# Patient Record
Sex: Male | Born: 1960 | Hispanic: Yes | Marital: Married | Smoking: Current every day smoker
Health system: Southern US, Community
[De-identification: ages and names within clinical notes are randomized; demographics above are authoritative.]

---

## 2016-05-26 ENCOUNTER — Emergency Department (HOSPITAL_COMMUNITY): Payer: Self-pay

## 2016-05-26 ENCOUNTER — Emergency Department (HOSPITAL_COMMUNITY)
Admission: EM | Admit: 2016-05-26 | Discharge: 2016-05-26 | Disposition: A | Discharge status: Home / Self Care | Payer: Self-pay | Attending: Emergency Medicine | Admitting: Emergency Medicine

## 2016-05-26 ENCOUNTER — Encounter (HOSPITAL_COMMUNITY): Payer: Self-pay | Admitting: *Deleted

## 2016-05-26 DIAGNOSIS — Y929 Unspecified place or not applicable: Secondary | ICD-10-CM | POA: Insufficient documentation

## 2016-05-26 DIAGNOSIS — Y939 Activity, unspecified: Secondary | ICD-10-CM | POA: Insufficient documentation

## 2016-05-26 DIAGNOSIS — S62634B Displaced fracture of distal phalanx of right ring finger, initial encounter for open fracture: Secondary | ICD-10-CM

## 2016-05-26 DIAGNOSIS — S62636A Displaced fracture of distal phalanx of right little finger, initial encounter for closed fracture: Secondary | ICD-10-CM | POA: Insufficient documentation

## 2016-05-26 DIAGNOSIS — Z23 Encounter for immunization: Secondary | ICD-10-CM | POA: Insufficient documentation

## 2016-05-26 DIAGNOSIS — Y990 Civilian activity done for income or pay: Secondary | ICD-10-CM | POA: Insufficient documentation

## 2016-05-26 DIAGNOSIS — W230XXA Caught, crushed, jammed, or pinched between moving objects, initial encounter: Secondary | ICD-10-CM | POA: Insufficient documentation

## 2016-05-26 DIAGNOSIS — F17200 Nicotine dependence, unspecified, uncomplicated: Secondary | ICD-10-CM | POA: Insufficient documentation

## 2016-05-26 DIAGNOSIS — S61218A Laceration without foreign body of other finger without damage to nail, initial encounter: Secondary | ICD-10-CM

## 2016-05-26 LAB — CBC WITH DIFFERENTIAL/PLATELET
Basophils Absolute: 0.1 10*3/uL (ref 0.0–0.1)
Basophils Relative: 1 %
Eosinophils Absolute: 0.2 10*3/uL (ref 0.0–0.7)
Eosinophils Relative: 3 %
HCT: 41.8 % (ref 39.0–52.0)
Hemoglobin: 14.6 g/dL (ref 13.0–17.0)
Lymphocytes Relative: 31 %
Lymphs Abs: 2.4 10*3/uL (ref 0.7–4.0)
MCH: 29.9 pg (ref 26.0–34.0)
MCHC: 34.9 g/dL (ref 30.0–36.0)
MCV: 85.5 fL (ref 78.0–100.0)
Monocytes Absolute: 0.7 10*3/uL (ref 0.1–1.0)
Monocytes Relative: 9 %
Neutro Abs: 4.5 10*3/uL (ref 1.7–7.7)
Neutrophils Relative %: 56 %
Platelets: 151 10*3/uL (ref 150–400)
RBC: 4.89 MIL/uL (ref 4.22–5.81)
RDW: 12.7 % (ref 11.5–15.5)
WBC: 7.8 10*3/uL (ref 4.0–10.5)

## 2016-05-26 LAB — BASIC METABOLIC PANEL
Anion gap: 9 (ref 5–15)
BUN: 16 mg/dL (ref 6–20)
CO2: 24 mmol/L (ref 22–32)
Calcium: 8.9 mg/dL (ref 8.9–10.3)
Chloride: 102 mmol/L (ref 101–111)
Creatinine, Ser: 0.77 mg/dL (ref 0.61–1.24)
GFR calc Af Amer: >60 mL/min (ref >60)
GFR calc non Af Amer: >60 mL/min (ref >60)
Glucose, Bld: 101 mg/dL — ABNORMAL HIGH (ref 65–99)
Potassium: 4.2 mmol/L (ref 3.5–5.1)
Sodium: 135 mmol/L (ref 135–145)

## 2016-05-26 MED ORDER — CEPHALEXIN 500 MG PO CAPS: 500.0000 mg | ORAL_CAPSULE | Freq: Four times a day (QID) | ORAL | 0 refills | Status: AC

## 2016-05-26 MED ORDER — CEFAZOLIN IN D5W 1 GM/50ML IV SOLN
1.0000 g | Freq: Once | INTRAVENOUS | Status: AC
  Administered 2016-05-26: 1 g via INTRAVENOUS
  Filled 2016-05-26: qty 50

## 2016-05-26 MED ORDER — LIDOCAINE HCL (PF) 1 % IJ SOLN
30.0000 mL | Freq: Once | INTRAMUSCULAR | Status: AC
  Administered 2016-05-26: 30 mL
  Filled 2016-05-26: qty 30

## 2016-05-26 MED ORDER — TETANUS-DIPHTH-ACELL PERTUSSIS 5-2.5-18.5 LF-MCG/0.5 IM SUSP
0.5000 mL | Freq: Once | INTRAMUSCULAR | Status: AC
  Administered 2016-05-26: 0.5 mL via INTRAMUSCULAR
  Filled 2016-05-26: qty 0.5

## 2016-05-26 MED ORDER — OXYCODONE-ACETAMINOPHEN 5-325 MG PO TABS: 1.0000 | ORAL_TABLET | Freq: Four times a day (QID) | ORAL | 0 refills | Status: AC | PRN

## 2016-05-26 NOTE — ED Notes (Signed)
Dressed pt's injured finger with xeroform gauze, gauze roll, and coban, per PA instructions.

## 2016-05-26 NOTE — ED Triage Notes (Addendum)
The pt was working with a drill  And his fingeer was caught between two pieces of wood an he has laceration through the mid and distal phalanx he came from a urgent care  Bandaged  No active bleeding.  His injury is his rt little finger

## 2016-05-26 NOTE — ED Notes (Signed)
Patient denies pain and is resting comfortably.  

## 2016-05-26 NOTE — ED Provider Notes (Signed)
[facility]-EMERGENCY DEPT Provider Note   CSN: [id number] Arrival date & time: 05/26/16  1544   By signing my name below, I, Clarisse Gouge, attest that this documentation has been prepared under the direction and in the presence of Buel Ream, PA-C. Electronically Signed: Clarisse Gouge, Scribe. 05/26/16. 5:10 PM.   History   Chief Complaint Chief Complaint  Patient presents with  . Laceration  The history is provided by the patient and a friend. No language interpreter was used.   HPI Comments: Kyle Hubbard is a 55 y.o. male otherwise healthy who presents to the Emergency Department complaining of a right small finger laceration that he sustained a few hours ago. A coworker states that the pt pinched the injured finger between two metal beams. Pt reports mild associated pain to the affected area. Per coworker, the bleeding has greatly decreased since the onset of the injury. Pt denies any other injury.   History reviewed. No pertinent past medical history.  There are no active problems to display for this patient.   History reviewed. No pertinent surgical history.     Home Medications    Prior to Admission medications   Medication Sig Start Date End Date Taking? Authorizing Provider  cephALEXin (KEFLEX) 500 MG capsule Take 1 capsule (500 mg total) by mouth 4 (four) times daily. 05/26/16   Emi Holes, PA-C  oxyCODONE-acetaminophen (PERCOCET/ROXICET) 5-325 MG tablet Take 1-2 tablets by mouth every 6 (six) hours as needed for severe pain. 05/26/16   Emi Holes, PA-C    Family History No family history on file.  Social History Social History  Substance Use Topics  . Smoking status: Current Every Day Smoker  . Smokeless tobacco: Never Used  . Alcohol use Yes     Allergies   Patient has no known allergies.   Review of Systems Review of Systems  Constitutional: Negative for chills and fever.  HENT: Negative for facial swelling and sore throat.   Respiratory:  Negative for shortness of breath.   Cardiovascular: Negative for chest pain.  Gastrointestinal: Negative for abdominal pain, nausea and vomiting.  Genitourinary: Negative for dysuria.  Musculoskeletal: Positive for myalgias. Negative for back pain.  Skin: Positive for wound. Negative for rash.  Neurological: Negative for headaches.  Psychiatric/Behavioral: The patient is not nervous/anxious.      Physical Exam Updated Vital Signs BP 158/74 (BP Location: Right Arm)   Pulse 61   Temp 98.4 F (36.9 C) (Oral)   Resp 20   SpO2 98%   Physical Exam  Constitutional: He appears well-developed and well-nourished. No distress.  HENT:  Head: Normocephalic and atraumatic.  Mouth/Throat: Oropharynx is clear and moist. No oropharyngeal exudate.  Eyes: Conjunctivae are normal. Pupils are equal, round, and reactive to light. Right eye exhibits no discharge. Left eye exhibits no discharge. No scleral icterus.  Neck: Normal range of motion. Neck supple. No thyromegaly present.  Cardiovascular: Normal rate, regular rhythm, normal heart sounds and intact distal pulses.  Exam reveals no gallop and no friction rub.   No murmur heard. Pulmonary/Chest: Effort normal and breath sounds normal. No stridor. No respiratory distress. He has no wheezes. He has no rales.  Abdominal: Soft. Bowel sounds are normal. He exhibits no distension. There is no tenderness. There is no rebound and no guarding.  Musculoskeletal: He exhibits no edema.  Right hand: Abduction, adduction intact; flexion of DIP not intact, extension intact; PIP flexion and extension intact; found to finger opposition intact; normal sensation; cap refill <2secs;  laceration to medial aspect of the fifth digit, 5 cm; shallow 2 cm laceration to dorsal fifth digit chest proximal to the nail; 2 cm laceration to finger pad; one severe laceration with protruding adipose; see images for more details  Lymphadenopathy:    He has no cervical adenopathy.    Neurological: He is alert. Coordination normal.  Skin: Skin is warm and dry. Capillary refill takes less than 2 seconds. No rash noted. He is not diaphoretic. No pallor.  Psychiatric: He has a normal mood and affect.  Nursing note and vitals reviewed.          ED Treatments / Results  DIAGNOSTIC STUDIES: Oxygen Saturation is 98% on RA, normal by my interpretation.    COORDINATION OF CARE: 4:32 PM Discussed treatment plan with pt at bedside and pt agreed to plan.    Labs (all labs ordered are listed, but only abnormal results are displayed) Labs Reviewed  BASIC METABOLIC PANEL - Abnormal; Notable for the following:       Result Value   Glucose, Bld 101 (*)    All other components within normal limits  CBC WITH DIFFERENTIAL/PLATELET    EKG  EKG Interpretation None       Radiology Dg Finger Little Right  Result Date: 05/26/2016 CLINICAL DATA:  55 year old male with a history of laceration. Industrial injury. EXAM: RIGHT LITTLE FINGER 2+V COMPARISON:  None. FINDINGS: Overlying dressing material somewhat obscures details, however, there is a fracture of the tuft of the distal phalanx of the fifth digit, with partials dorsal subluxation of the distal fracture fragment. There appears to be a fracture involving the base of the distal phalanx of the fifth digit, ulnar aspect. Overlying soft tissue injury. No radiopaque foreign body. IMPRESSION: Open fracture of the distal phalanx of the fifth digit right hand, with partially displaced fracture at the tuft, as well as a fracture line involving the base of the distal phalanx. Soft tissue disruption. Signed, Yvone NeuJaime S. Loreta AveWagner, DO [facility] Electronically Signed   By: Gilmer MorJaime  Wagner D.O.   On: 05/26/2016 17:23    Procedures Procedures (including critical care time)  LACERATION REPAIR Performed by: Emi HolesAlexandra M Claryssa Sandner Authorized by: Emi HolesAlexandra M Jaiyla Granados Consent: Verbal consent  obtained. Risks and benefits: risks, benefits and alternatives were discussed Consent given by: patient Patient identity confirmed: provided demographic data Prepped and Draped in normal sterile fashion Wound explored  Laceration Location: R small finger  Laceration Length: 5 cm  No Foreign Bodies seen or palpated  Anesthesia: Digital block  Anesthetic: lidocaine 1% w/o epinephrine  Anesthetic total: 7 ml  Irrigation method: syringe Amount of cleaning: standard  Skin closure: 4-0 Ethilon  Number of sutures: 2  Technique: simple interrupted  Patient tolerance: Patient tolerated the procedure well with no immediate complications.  LACERATION REPAIR Performed by: Emi HolesAlexandra M Lou Irigoyen Authorized by: Emi HolesAlexandra M Sael Furches Consent: Verbal consent obtained. Risks and benefits: risks, benefits and alternatives were discussed Consent given by: patient Patient identity confirmed: provided demographic data Prepped and Draped in normal sterile fashion Wound explored, adipose debrided  Laceration Location: R small finger  Laceration Length: 1 cm  No Foreign Bodies seen or palpated  Anesthesia: Digital block  Anesthetic: lidocaine 1% w/o epinephrine  Anesthetic total: 7 ml  Irrigation method: syringe Amount of cleaning: standard  Skin closure: 4-0 Ethilon  Number of sutures: 2  Technique: simple interrupted  Patient tolerance: Patient tolerated the procedure well with no immediate complications.   Medications  Ordered in ED Medications  Tdap (BOOSTRIX) injection 0.5 mL (0.5 mLs Intramuscular Given 05/26/16 1706)  ceFAZolin (ANCEF) IVPB 1 g/50 mL premix (0 g Intravenous Stopped 05/26/16 1808)  lidocaine (PF) (XYLOCAINE) 1 % injection 30 mL (30 mLs Infiltration Given 05/26/16 1840)     Initial Impression / Assessment and Plan / ED Course  I have reviewed the triage vital signs and the nursing notes.  Pertinent labs & imaging results that were available during my care of  the patient were reviewed by me and considered in my medical decision making (see chart for details).  Clinical Course     Patient with open fracture, distal tuft of distal phalanx, partially displaced, as well as a fracture line involving the base of the distal phalanx. CBC, BMP unremarkable. Tetanus updated in ED. Laceration occurred < 12 hours prior to repair. I spoke with Dr. Izora Ribasoley, hand surgery, who advised to only loosely approximate with 1-2 sutures in each laceration due to crush injury causing edema to finger. I was unable to close laceration to digital pad due to decreased strength the tissue, Dr. Izora Ribasoley advised that this was fine and to have patient follow up in 2 days. Wound dressed with Xeroform and Coban. Patient given Ancef in the ED and discharged home with Keflex. Patient also discharged home with pain medication. Patient advised to leave dressing applied until patient is seen by Dr. Izora Ribasoley. Patient and family understand and agree with plan. Patient vitals stable throughout ED course and discharged in satisfactory condition. Patient also evaluated by Dr. Erma HeritageIsaacs.  Final Clinical Impressions(s) / ED Diagnoses   Final diagnoses:  Laceration of little finger  Open displaced fracture of distal phalanx of right ring finger, initial encounter    New Prescriptions Discharge Medication List as of 05/26/2016  9:47 PM    START taking these medications   Details  cephALEXin (KEFLEX) 500 MG capsule Take 1 capsule (500 mg total) by mouth 4 (four) times daily., Starting Sat 05/26/2016, Print    oxyCODONE-acetaminophen (PERCOCET/ROXICET) 5-325 MG tablet Take 1-2 tablets by mouth every 6 (six) hours as needed for severe pain., Starting Sat 05/26/2016, Print      I personally performed the services described in this documentation, which was scribed in my presence. The recorded information has been reviewed and is accurate.    Emi Holeslexandra M Kaiyu Mirabal, PA-C 05/28/16 0019    Shaune Pollackameron Isaacs,  MD 05/28/16 1740

## 2016-05-26 NOTE — Discharge Instructions (Signed)
Medications: Keflex, Percocet  Treatment: Take Keflex as prescribed for 1 week. Take Percocet every 4-6 hours as needed for severe pain. Do not drive or operate machinery when taking this medication. You can take ibuprofen as prescribed over-the-counter for mild to moderate pain, or if you will be driving or operating machinery. Leave dressing on until your seen by Dr. Izora Ribasoley.  Follow-up: Please call Dr. Debby Budoley's office Monday morning to be seen. It is important that you follow-up with him. Please return to emergency department if you develop any new or worsening symptoms including increasing pain, swelling, drainage, streaking from the wound.

## 2016-05-26 NOTE — ED Notes (Signed)
Pt departed in NAD.  

## 2016-05-26 NOTE — ED Triage Notes (Signed)
PT reports he needs a tetanus shot.

## 2017-04-19 IMAGING — CR DG FINGER LITTLE 2+V*R*
3 series · 3 of 3 positions shown · non-contrast
Comparison: None.

CLINICAL DATA: 55-year-old male with a history of laceration.
Industrial injury.

EXAM:
RIGHT LITTLE FINGER 2+V

[finger ap]
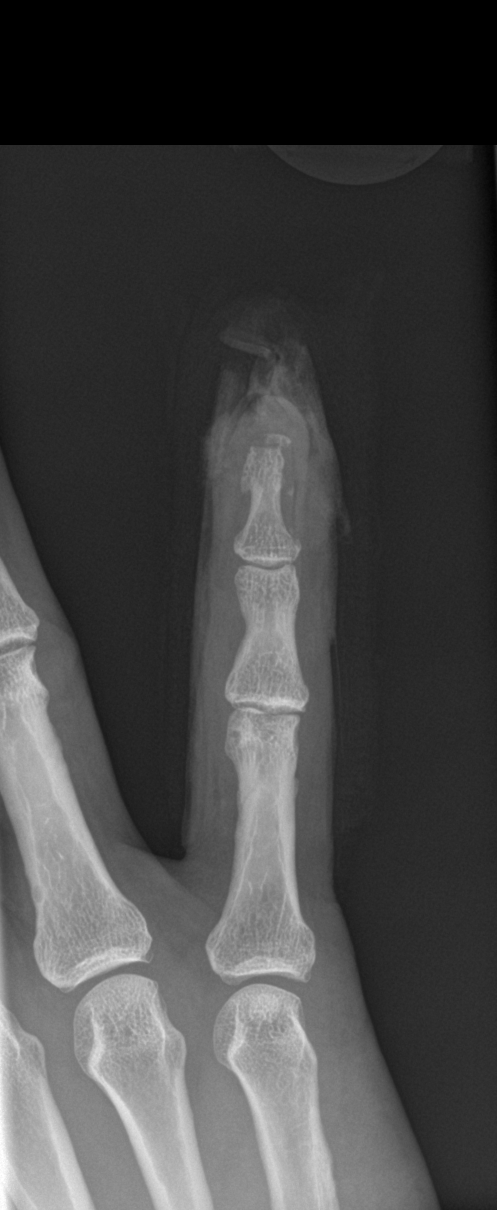

[finger obl]
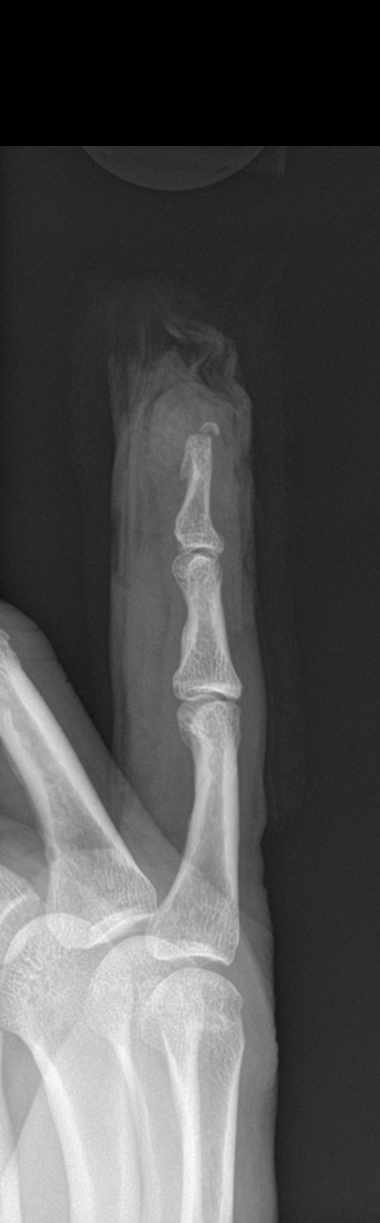

[finger lat]
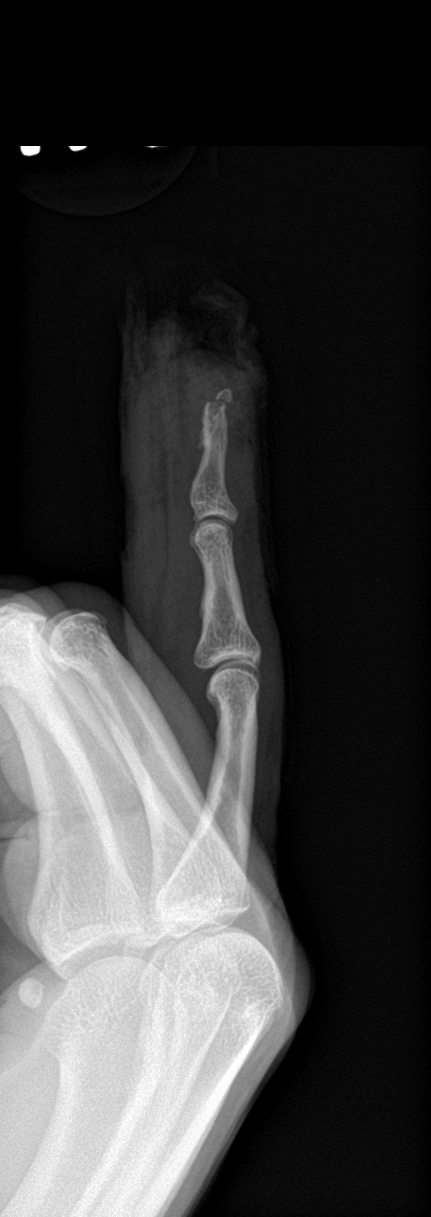

[3 of 3 positions shown; findings below may reference images not displayed]

FINDINGS: Overlying dressing material somewhat obscures details, however,
there is a fracture of the tuft of the distal phalanx of the fifth
digit, with partials dorsal subluxation of the distal fracture
fragment.

There appears to be a fracture involving the base of the distal
phalanx of the fifth digit, ulnar aspect. Overlying soft tissue
injury. No radiopaque foreign body.
IMPRESSION: Open fracture of the distal phalanx of the fifth digit right hand,
with partially displaced fracture at the tuft, as well as a fracture
line involving the base of the distal phalanx. Soft tissue
disruption.
